# Patient Record
Sex: Male | Born: 1950 | Race: Black or African American | Hispanic: No | Marital: Married | Smoking: Former smoker
Health system: Southern US, Community
[De-identification: ages and names within clinical notes are randomized; demographics above are authoritative.]

## PROBLEM LIST (undated history)

## (undated) DIAGNOSIS — I10 Essential (primary) hypertension: Secondary | ICD-10-CM

## (undated) DIAGNOSIS — N189 Chronic kidney disease, unspecified: Secondary | ICD-10-CM

## (undated) HISTORY — PX: JOINT REPLACEMENT: SHX530

## (undated) HISTORY — DX: Chronic kidney disease, unspecified: N18.9

---

## 2017-02-06 ENCOUNTER — Emergency Department (HOSPITAL_COMMUNITY)
Admission: EM | Admit: 2017-02-06 | Discharge: 2017-02-06 | Disposition: A | Discharge status: Home / Self Care | Payer: Self-pay | Attending: Emergency Medicine | Admitting: Emergency Medicine

## 2017-02-06 ENCOUNTER — Encounter (HOSPITAL_COMMUNITY): Payer: Self-pay | Admitting: *Deleted

## 2017-02-06 ENCOUNTER — Other Ambulatory Visit: Payer: Self-pay

## 2017-02-06 DIAGNOSIS — R1032 Left lower quadrant pain: Secondary | ICD-10-CM | POA: Insufficient documentation

## 2017-02-06 DIAGNOSIS — Z5321 Procedure and treatment not carried out due to patient leaving prior to being seen by health care provider: Secondary | ICD-10-CM | POA: Insufficient documentation

## 2017-02-06 HISTORY — DX: Essential (primary) hypertension: I10

## 2017-02-06 NOTE — ED Triage Notes (Signed)
Pt is here with left groin pain for one week.  Pt states it was originally a sharp pain and has increased.  Pt has pain with movement but does not feel a knot there or have abdominal pain

## 2017-02-06 NOTE — ED Notes (Signed)
Called x3 for vitals. No answer. 

## 2017-02-06 NOTE — ED Notes (Signed)
Called pt x1 to re-assess vitals, no answer

## 2017-02-06 NOTE — ED Notes (Signed)
Called pt x2 for vital signs. No answer

## 2017-02-06 NOTE — ED Notes (Signed)
Called for patient without response

## 2017-02-08 ENCOUNTER — Emergency Department (HOSPITAL_COMMUNITY)
Admission: EM | Admit: 2017-02-08 | Discharge: 2017-02-08 | Disposition: A | Discharge status: Home / Self Care | Payer: Medicare Other | Attending: Emergency Medicine | Admitting: Emergency Medicine

## 2017-02-08 ENCOUNTER — Encounter (HOSPITAL_COMMUNITY): Payer: Self-pay | Admitting: Emergency Medicine

## 2017-02-08 ENCOUNTER — Other Ambulatory Visit: Payer: Self-pay

## 2017-02-08 DIAGNOSIS — Z5321 Procedure and treatment not carried out due to patient leaving prior to being seen by health care provider: Secondary | ICD-10-CM | POA: Insufficient documentation

## 2017-02-08 DIAGNOSIS — K4090 Unilateral inguinal hernia, without obstruction or gangrene, not specified as recurrent: Secondary | ICD-10-CM | POA: Insufficient documentation

## 2017-02-08 NOTE — ED Triage Notes (Signed)
Pt has inguinal hernia in left groin that he states has been there about 1 week.

## 2017-02-08 NOTE — ED Notes (Signed)
Per registration, pt left without being seen after triage. 

## 2017-12-23 ENCOUNTER — Ambulatory Visit: Payer: Medicare Other | Admitting: Family Medicine

## 2018-01-28 ENCOUNTER — Other Ambulatory Visit: Payer: Self-pay

## 2018-01-28 ENCOUNTER — Emergency Department (HOSPITAL_COMMUNITY)
Admission: EM | Admit: 2018-01-28 | Discharge: 2018-01-29 | Disposition: A | Discharge status: Home / Self Care | Payer: Medicare Other | Attending: Emergency Medicine | Admitting: Emergency Medicine

## 2018-01-28 ENCOUNTER — Encounter (HOSPITAL_COMMUNITY): Payer: Self-pay | Admitting: *Deleted

## 2018-01-28 DIAGNOSIS — Z96652 Presence of left artificial knee joint: Secondary | ICD-10-CM | POA: Insufficient documentation

## 2018-01-28 DIAGNOSIS — R232 Flushing: Secondary | ICD-10-CM | POA: Insufficient documentation

## 2018-01-28 DIAGNOSIS — Z87891 Personal history of nicotine dependence: Secondary | ICD-10-CM | POA: Insufficient documentation

## 2018-01-28 DIAGNOSIS — R5383 Other fatigue: Secondary | ICD-10-CM | POA: Diagnosis present

## 2018-01-28 DIAGNOSIS — I10 Essential (primary) hypertension: Secondary | ICD-10-CM | POA: Diagnosis not present

## 2018-01-28 DIAGNOSIS — R531 Weakness: Secondary | ICD-10-CM | POA: Insufficient documentation

## 2018-01-28 LAB — CBC
HCT: 44.2 % (ref 39.0–52.0)
Hemoglobin: 14.9 g/dL (ref 13.0–17.0)
MCH: 31.4 pg (ref 26.0–34.0)
MCHC: 33.7 g/dL (ref 30.0–36.0)
MCV: 93.2 fL (ref 80.0–100.0)
Platelets: 226 10*3/uL (ref 150–400)
RBC: 4.74 MIL/uL (ref 4.22–5.81)
RDW: 13.1 % (ref 11.5–15.5)
WBC: 4.2 10*3/uL (ref 4.0–10.5)
nRBC: 0 % (ref 0.0–0.2)

## 2018-01-28 LAB — CBG MONITORING, ED: Glucose-Capillary: 110 mg/dL — ABNORMAL HIGH (ref 70–99)

## 2018-01-28 LAB — BASIC METABOLIC PANEL
Anion gap: 11 (ref 5–15)
BUN: 16 mg/dL (ref 8–23)
CO2: 20 mmol/L — ABNORMAL LOW (ref 22–32)
Calcium: 10 mg/dL (ref 8.9–10.3)
Chloride: 108 mmol/L (ref 98–111)
Creatinine, Ser: 1.42 mg/dL — ABNORMAL HIGH (ref 0.61–1.24)
GFR calc Af Amer: 58 mL/min — ABNORMAL LOW (ref >60)
GFR calc non Af Amer: 50 mL/min — ABNORMAL LOW (ref >60)
Glucose, Bld: 120 mg/dL — ABNORMAL HIGH (ref 70–99)
POTASSIUM: 3.6 mmol/L (ref 3.5–5.1)
Sodium: 139 mmol/L (ref 135–145)

## 2018-01-28 NOTE — ED Triage Notes (Signed)
Pt is here with hot flashes since waking up today and has had these since August.   Pt reports increased blood pressures, vision blurred last night and not now, had episodes of feeling faint.  [facility]

## 2018-01-29 ENCOUNTER — Emergency Department (HOSPITAL_COMMUNITY): Payer: Medicare Other

## 2018-01-29 DIAGNOSIS — R5383 Other fatigue: Secondary | ICD-10-CM | POA: Diagnosis present

## 2018-01-29 DIAGNOSIS — R232 Flushing: Secondary | ICD-10-CM | POA: Diagnosis not present

## 2018-01-29 DIAGNOSIS — Z87891 Personal history of nicotine dependence: Secondary | ICD-10-CM | POA: Diagnosis not present

## 2018-01-29 DIAGNOSIS — Z96652 Presence of left artificial knee joint: Secondary | ICD-10-CM | POA: Diagnosis not present

## 2018-01-29 DIAGNOSIS — I10 Essential (primary) hypertension: Secondary | ICD-10-CM | POA: Diagnosis not present

## 2018-01-29 DIAGNOSIS — R531 Weakness: Secondary | ICD-10-CM | POA: Diagnosis not present

## 2018-01-29 LAB — I-STAT TROPONIN, ED: Troponin i, poc: 0 ng/mL (ref 0.00–0.08)

## 2018-01-29 LAB — TSH: TSH: 1.397 u[IU]/mL (ref 0.350–4.500)

## 2018-01-29 LAB — URINALYSIS, ROUTINE W REFLEX MICROSCOPIC
BILIRUBIN URINE: NEGATIVE
Bacteria, UA: NONE SEEN
Glucose, UA: NEGATIVE mg/dL
Hgb urine dipstick: NEGATIVE
KETONES UR: 20 mg/dL — AB
LEUKOCYTES UA: NEGATIVE
Nitrite: NEGATIVE
PROTEIN: 30 mg/dL — AB
Specific Gravity, Urine: 1.016 (ref 1.005–1.030)
pH: 5 (ref 5.0–8.0)

## 2018-01-29 MED ORDER — ACETAMINOPHEN 325 MG PO TABS
650.0000 mg | ORAL_TABLET | Freq: Once | ORAL | Status: AC
  Administered 2018-01-29: 650 mg via ORAL
  Filled 2018-01-29: qty 2

## 2018-01-29 MED ORDER — SODIUM CHLORIDE 0.9 % IV BOLUS
1000.0000 mL | Freq: Once | INTRAVENOUS | Status: AC
  Administered 2018-01-29: 1000 mL via INTRAVENOUS

## 2018-01-29 NOTE — ED Provider Notes (Signed)
[facility] Provider Note   CSN: [id number] Arrival date & time: 01/28/18  1659     History   Chief Complaint Chief Complaint  Patient presents with  . Weakness  . hot flashes    HPI Dan Cheneybdullah Sandoval is a 68 y.o. male.  The history is provided by the patient and medical records.  Weakness      68 year old male with history of hypertension, hyperlipidemia, depression, mood disorder, presenting to the ED for several concerns.  Patient reports since August he has not felt quite right.  States that the first part of August he began having a lot of hot flashes.  States initially it would happen for a day or so and then completely go away.  States lately these have become more frequent.  He saw his PCP at the [facility] about a month ago and had lots of lab work done and was told overall everything was normal.  He states they are referring him to endocrinology for further evaluation but has not yet had that appointment.  States of the past few days he has felt very fatigued with low energy and decreased appetite.  He has not had any nausea or vomiting.  He has been drinking fluids fine.  He does not have any focal numbness or weakness.  Does report some headaches but unsure if this is from dehydration.  States he has felt lightheaded at times and almost like he may pass out but has not lost consciousness.  He denies any chest pain or shortness of breath.  No noted fever, chills, cough, or other infectious symptoms.  He has had some frequent urination but denies any dysuria.  He has not had any recent changes in his medications.  Past Medical History:  Diagnosis Date  . Hypertension     There are no active problems to display for this patient.   Past Surgical History:  Procedure Laterality Date  . JOINT REPLACEMENT     left knee ligament repair        Home Medications    Prior to Admission medications   Not on File    Family History No family  history on file.  Social History Social History   Tobacco Use  . Smoking status: Former Games developermoker  . Smokeless tobacco: Never Used  Substance Use Topics  . Alcohol use: No    Frequency: Never  . Drug use: No     Allergies   Penicillins   Review of Systems Review of Systems  Neurological: Positive for weakness.  All other systems reviewed and are negative.    Physical Exam Updated Vital Signs BP (!) 157/105 (BP Location: Right Arm)   Pulse 88   Temp 97.9 F (36.6 C) (Oral)   Resp 14   SpO2 100%   Physical Exam Vitals signs and nursing note reviewed.  Constitutional:      Appearance: He is well-developed.  HENT:     Head: Normocephalic and atraumatic.     Right Ear: Tympanic membrane and ear canal normal.     Left Ear: Tympanic membrane and ear canal normal.     Nose: Nose normal.     Mouth/Throat:     Lips: Pink.     Mouth: Mucous membranes are moist.     Pharynx: Oropharynx is clear.  Eyes:     Conjunctiva/sclera: Conjunctivae normal.     Pupils: Pupils are equal, round, and reactive to light.     Comments: Haziness  of the eyes which is chronic, PERRL  Neck:     Musculoskeletal: Normal range of motion. No neck rigidity.     Comments: Full ROM, no rigidity Cardiovascular:     Rate and Rhythm: Normal rate and regular rhythm.     Heart sounds: Normal heart sounds.  Pulmonary:     Effort: Pulmonary effort is normal.     Breath sounds: Normal breath sounds. No decreased breath sounds, wheezing or rhonchi.  Abdominal:     General: Bowel sounds are normal. There is no distension.     Palpations: Abdomen is soft.     Tenderness: There is no abdominal tenderness.  Musculoskeletal: Normal range of motion.  Skin:    General: Skin is warm and dry.  Neurological:     Mental Status: He is alert and oriented to person, place, and time.     Comments: AAOx3, answering questions and following commands appropriately; equal strength UE and LE bilaterally; CN grossly  intact; moves all extremities appropriately without ataxia; no focal neuro deficits or facial asymmetry appreciated      ED Treatments / Results  Labs (all labs ordered are listed, but only abnormal results are displayed) Labs Reviewed  BASIC METABOLIC PANEL - Abnormal; Notable for the following components:      Result Value   CO2 20 (*)    Glucose, Bld 120 (*)    Creatinine, Ser 1.42 (*)    GFR calc non Af Amer 50 (*)    GFR calc Af Amer 58 (*)    All other components within normal limits  URINALYSIS, ROUTINE W REFLEX MICROSCOPIC - Abnormal; Notable for the following components:   Ketones, ur 20 (*)    Protein, ur 30 (*)    All other components within normal limits  CBG MONITORING, ED - Abnormal; Notable for the following components:   Glucose-Capillary 110 (*)    All other components within normal limits  CBC  TSH  I-STAT TROPONIN, ED    EKG None  Radiology Dg Chest 2 View  Result Date: 01/29/2018 CLINICAL DATA:  Generalized weakness EXAM: CHEST - 2 VIEW COMPARISON:  None. FINDINGS: Normal heart size. Normal mediastinal contour. No pneumothorax. No pleural effusion. Hyperinflated lungs. Symmetric nipple shadows overlie the lower lungs on the PA view. No pulmonary edema. No acute consolidative airspace disease. IMPRESSION: 1. No acute cardiopulmonary disease. 2. Hyperinflated lungs, suggesting COPD. Electronically Signed   By: Delbert Phenix M.D.   On: 01/29/2018 00:57    Procedures Procedures (including critical care time)  Medications Ordered in ED Medications  sodium chloride 0.9 % bolus 1,000 mL (0 mLs Intravenous Stopped 01/29/18 0257)  acetaminophen (TYLENOL) tablet 650 mg (650 mg Oral Given 01/29/18 0118)     Initial Impression / Assessment and Plan / ED Course  I have reviewed the triage vital signs and the nursing notes.  Pertinent labs & imaging results that were available during my care of the patient were reviewed by me and considered in my medical decision  making (see chart for details).  68 year old male here with generalized weakness and hot flashes.  This is been an intermittent issue since August 2019 (approx 4 months now).  States he has been seen by PCP and awaiting endocrinology referral.  He has had screening labs with primary care doctor without known cause.  States hot flashes are becoming more frequent and he is not sure what to do.  He is afebrile and nontoxic in appearance here.  He does  not have any infectious type symptoms.  His blood pressure is somewhat elevated and he has headache.  He does not have any focal weakness and is neurologically intact on exam.  He does not have any signs or symptoms suggestive of meningitis.  Screening labs were obtained from triage and are overall reassuring.  We will add troponin, chest x-ray, TSH, and urinalysis.  Patient has not had any imaging of his head thus far in his work-up, will obtain CT to evaluate for possible pituitary abnormalities.  3:05 AM Notified by CT that they attempted scan x2, however patient is very anxious and claustrophobic and does not feel he can tolerate this.    I discussed with patient, he is feeling better here after some fluids and Tylenol.  His headache has resolved.  BP is stable, no evidence of end organ damage today.  He is not currently having any further hot flashes.  We reviewed his labs and chest x-ray from earlier which are overall reassuring including TSH.  I do not feel strongly that he absolutely has to have head CT today is unlikely to change management acutely.  I do feel he would be best suited to follow-up with endocrinology, they will call in the morning and follow-up on scheduled appointment.  He was given copies of his labs and imaging studies from today's visit for physician review.  They will return here for any new or worsening symptoms.  Final Clinical Impressions(s) / ED Diagnoses   Final diagnoses:  Hot flashes  Generalized weakness    ED Discharge  Orders    None       Garlon HatchetSanders, Melvin Marmo M, PA-C 01/29/18 [id number]    Nira Connardama, Pedro Eduardo, MD 01/29/18 2115

## 2018-01-29 NOTE — Discharge Instructions (Signed)
Continue your regular medications. Make sure you are drinking plenty of fluids to stay hydrated. Follow-up with endocrinology-- call in the morning to follow-up about your appt.   Have your primary care doctor review labs and imaging studies from today's visit (attached on back). Return to the ED for new or worsening symptoms.

## 2018-01-29 NOTE — ED Notes (Signed)
Patient verbalizes understanding of discharge instructions. Opportunity for questioning and answers were provided. Armband removed by staff, pt discharged from ED. Ambulated out to lobby  

## 2019-06-04 IMAGING — CR DG CHEST 2V
2 series · 2 of 2 positions shown · non-contrast
Comparison: None.

CLINICAL DATA: Generalized weakness

EXAM:
CHEST - 2 VIEW

[chest pa]
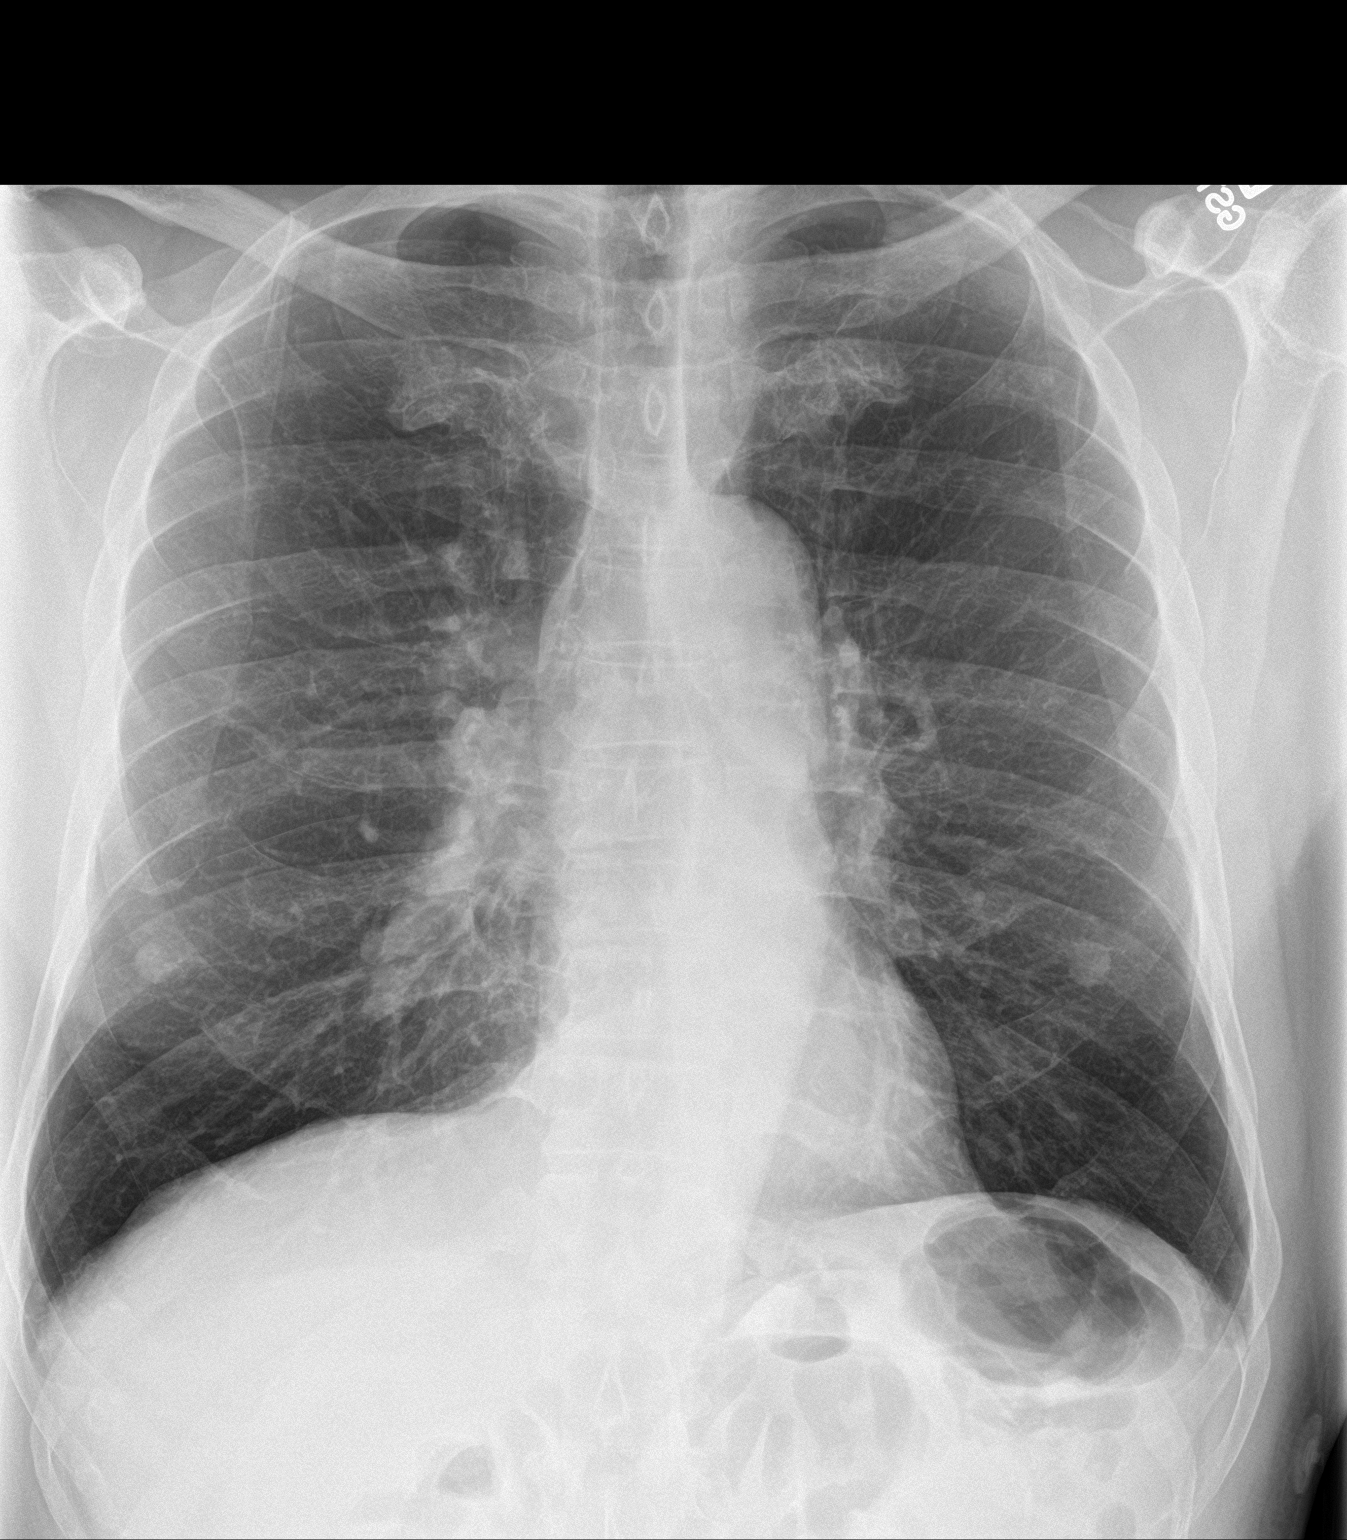

[chest lat]
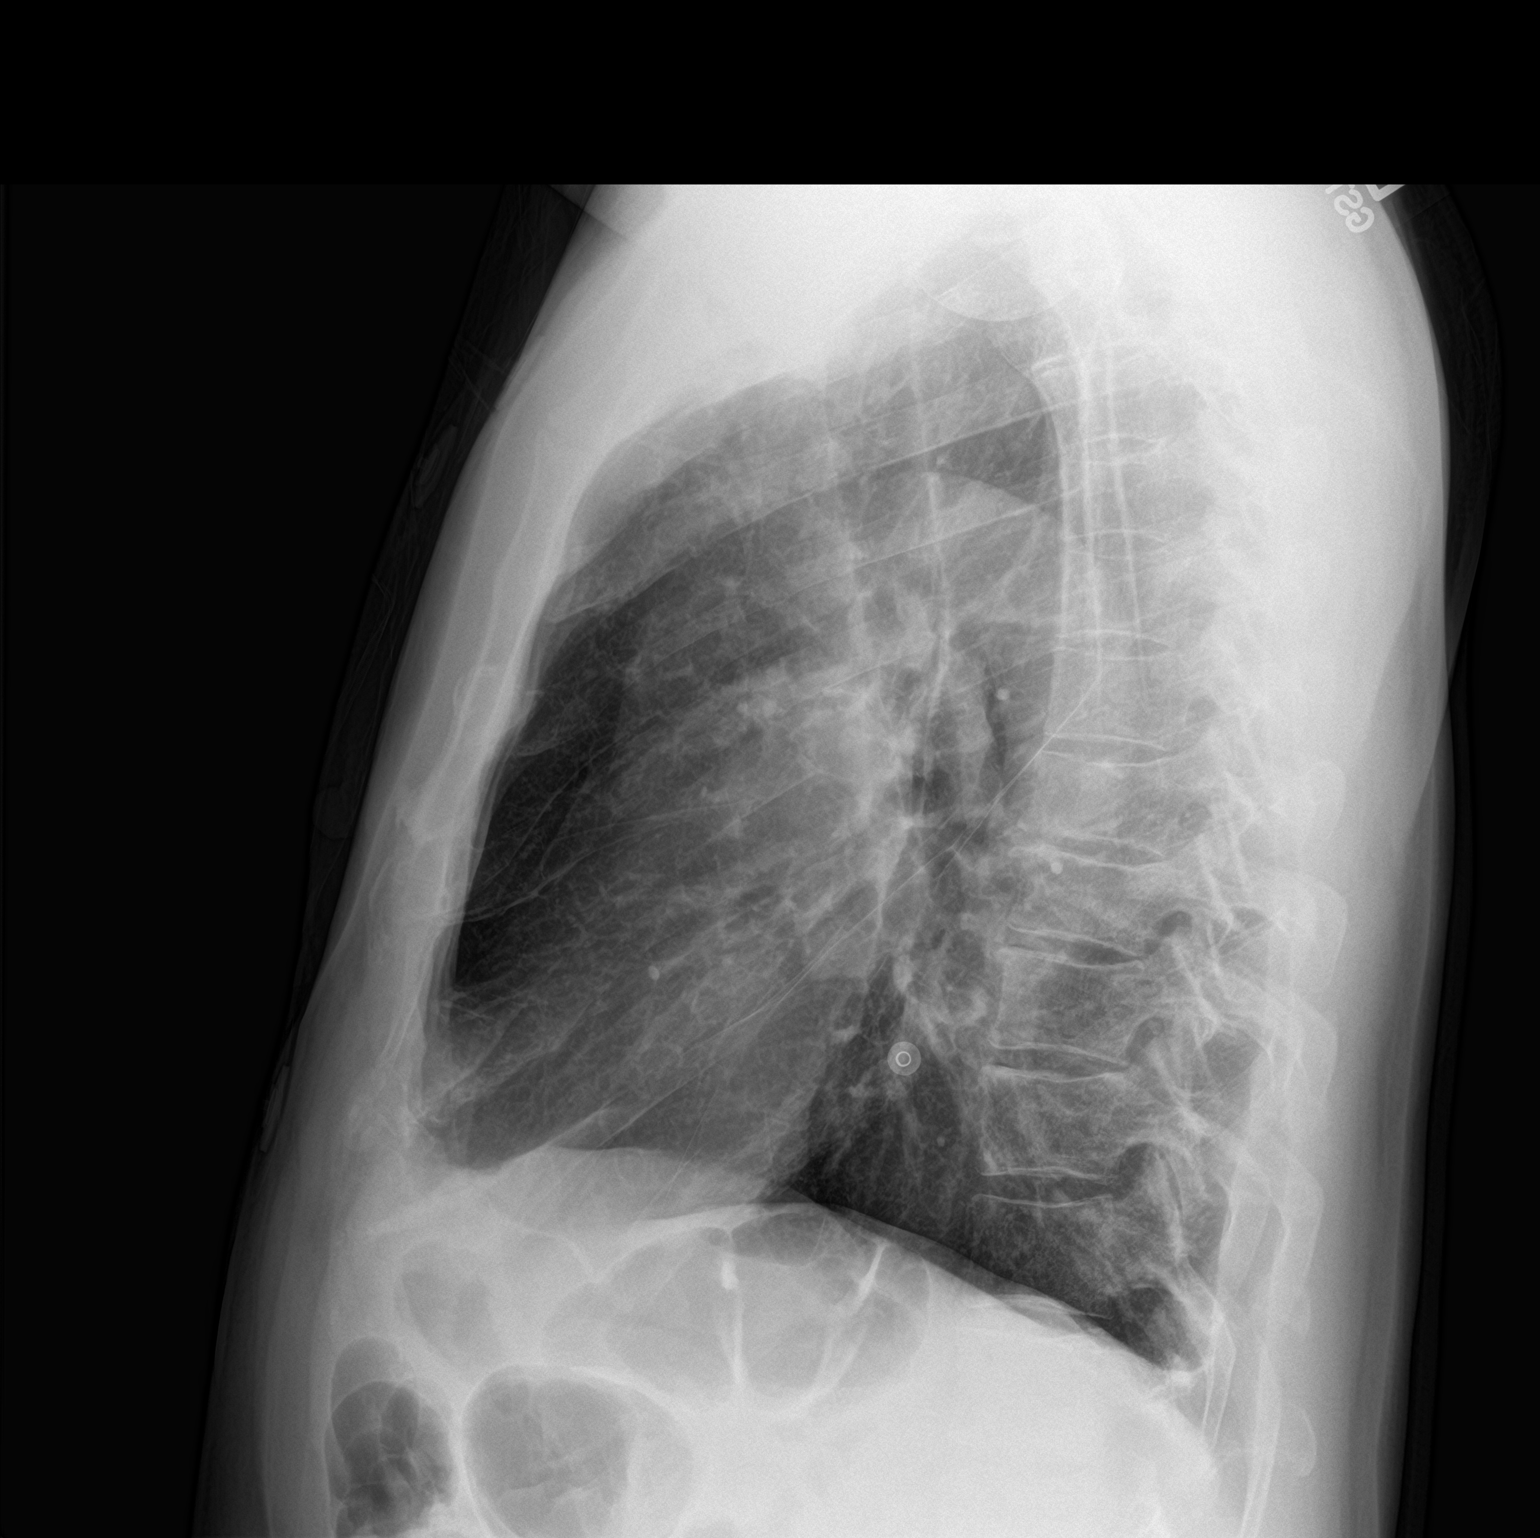

[2 of 2 positions shown; findings below may reference images not displayed]

FINDINGS: Normal heart size. Normal mediastinal contour. No pneumothorax. No
pleural effusion. Hyperinflated lungs. Symmetric nipple shadows
overlie the lower lungs on the PA view. No pulmonary edema. No acute
consolidative airspace disease.
IMPRESSION: 1. No acute cardiopulmonary disease.
2. Hyperinflated lungs, suggesting COPD.

## 2020-01-14 ENCOUNTER — Encounter (HOSPITAL_COMMUNITY): Payer: Self-pay

## 2020-01-14 ENCOUNTER — Other Ambulatory Visit: Payer: Self-pay

## 2020-01-14 ENCOUNTER — Ambulatory Visit (HOSPITAL_COMMUNITY)
Admission: EM | Admit: 2020-01-14 | Discharge: 2020-01-14 | Disposition: A | Discharge status: Home / Self Care | Payer: Medicare Other | Attending: Student | Admitting: Student

## 2020-01-14 DIAGNOSIS — L739 Follicular disorder, unspecified: Secondary | ICD-10-CM

## 2020-01-14 DIAGNOSIS — R21 Rash and other nonspecific skin eruption: Secondary | ICD-10-CM

## 2020-01-14 MED ORDER — MUPIROCIN CALCIUM 2 % EX CREA: 1.0000 "application " | TOPICAL_CREAM | Freq: Two times a day (BID) | CUTANEOUS | 0 refills | Status: DC

## 2020-01-14 NOTE — Discharge Instructions (Signed)
Apply ointment to your scalp twice daily. If no improvement in 1-2 weeks, make an appointment with a dermatologist.   If you have weakness/numbness in your arms or legs, chest pain, shortness of breath, fevers- seek medical attention.

## 2020-01-14 NOTE — ED Triage Notes (Signed)
Pt presents with ongoing headache for past few weeks; pt states he has a very tender spot on the top of his head.

## 2020-01-14 NOTE — ED Provider Notes (Signed)
[facility]    CSN: [id number] Arrival date & time: 01/14/20  0034      History   Chief Complaint Chief Complaint  Patient presents with  . Headache    HPI Dan Sandoval is a 69 y.o. male presenting with headache for few weeks. States that there's been a tender spot on top of head for 2 weeks. It's tender to the touch. He denies using new products, denies recent haircut though he does shave his head at home. Denies sensitivity to light, photophobia, phonophobia, tearing. Denies pain, weakness, sensation changes, numbness - in arms or legs. Denies chest pain. Denies dizziness. Denies LOC. Denies n/v/d.    HPI  Past Medical History:  Diagnosis Date  . Hypertension     There are no problems to display for this patient.   Past Surgical History:  Procedure Laterality Date  . JOINT REPLACEMENT     left knee ligament repair       Home Medications    Prior to Admission medications   Medication Sig Start Date End Date Taking? Authorizing Provider  acetaminophen (TYLENOL) 500 MG tablet Take 500 mg by mouth 2 (two) times daily as needed for mild pain.    [provider]  amLODipine (NORVASC) 10 MG tablet Take 10 mg by mouth daily.    [provider]  busPIRone (BUSPAR) 10 MG tablet Take 10 mg by mouth 3 (three) times daily.    [provider]  Cholecalciferol (VITAMIN D) 50 MCG (2000 UT) tablet Take 2,000 Units by mouth daily.    [provider]  hydrophilic ointment Apply topically as needed for dry skin.    [provider]  hydrOXYzine (VISTARIL) 25 MG capsule Take 25 mg by mouth every 6 (six) hours as needed for anxiety.    [provider]  losartan (COZAAR) 100 MG tablet Take 50 mg by mouth daily.    [provider]  methocarbamol (ROBAXIN) 500 MG tablet Take 500 mg by mouth at bedtime as needed for muscle spasms.    [provider]  Multiple Vitamin (MULTIVITAMIN WITH MINERALS) TABS  tablet Take 1 tablet by mouth daily.    [provider]  mupirocin cream (BACTROBAN) 2 % Apply 1 application topically 2 (two) times daily. 01/14/20   Rhys Martini, PA-C  omeprazole (PRILOSEC) 20 MG capsule Take 20 mg by mouth daily.    [provider]  Paraffin WAX by Does not apply route daily as needed (to affected area).    [provider]  prazosin (MINIPRESS) 1 MG capsule Take 1 mg by mouth at bedtime.    [provider]  tadalafil (CIALIS) 5 MG tablet Take 5 mg by mouth daily as needed for erectile dysfunction.    [provider]  venlafaxine XR (EFFEXOR-XR) 150 MG 24 hr capsule Take 150 mg by mouth daily with breakfast.    [provider]  zolpidem (AMBIEN) 10 MG tablet Take 10 mg by mouth at bedtime as needed for sleep.    [provider]    Family History History reviewed. No pertinent family history.  Social History Social History   Tobacco Use  . Smoking status: Former Games developer  . Smokeless tobacco: Never Used  Substance Use Topics  . Alcohol use: No  . Drug use: No     Allergies   Penicillins   Review of Systems Review of Systems  Constitutional: Negative for chills and fever.  HENT: Negative for congestion, ear pain,  sinus pain and sore throat.   Eyes: Negative for photophobia and visual disturbance.  Respiratory: Negative for cough, chest tightness and shortness of breath.   Cardiovascular: Negative for chest pain and palpitations.  Gastrointestinal: Negative for abdominal pain, diarrhea, nausea and vomiting.  Neurological: Positive for headaches. Negative for dizziness, tremors, syncope, speech difficulty, weakness and numbness.   Physical Exam Triage Vital Signs ED Triage Vitals  Enc Vitals Group     BP 01/14/20 1220 (!) 146/88     Pulse Rate 01/14/20 1220 91     Resp 01/14/20 1220 17     Temp 01/14/20 1220 99.2 F (37.3 C)     Temp Source 01/14/20 1220 Oral     SpO2 01/14/20 1220 97 %      Weight --      Height --      Head Circumference --      Peak Flow --      Pain Score 01/14/20 1219 6     Pain Loc --      Pain Edu? --      Excl. in GC? --    No data found.  Updated Vital Signs BP (!) 146/88 (BP Location: Right Arm)   Pulse 91   Temp 99.2 F (37.3 C) (Oral)   Resp 17   SpO2 97%   Visual Acuity Right Eye Distance:   Left Eye Distance:   Bilateral Distance:    Right Eye Near:   Left Eye Near:    Bilateral Near:     Physical Exam Vitals reviewed.  Constitutional:      General: He is not in acute distress.    Appearance: Normal appearance. He is well-developed. He is not ill-appearing.  HENT:     Head: Normocephalic and atraumatic.     Mouth/Throat:     Mouth: Mucous membranes are moist.  Eyes:     General: Vision grossly intact. Gaze aligned appropriately.     Extraocular Movements: Extraocular movements intact.     Pupils: Pupils are equal, round, and reactive to light.  Cardiovascular:     Rate and Rhythm: Normal rate and regular rhythm.     Heart sounds: Normal heart sounds.  Pulmonary:     Effort: Pulmonary effort is normal.     Breath sounds: Normal breath sounds and air entry.  Abdominal:     Palpations: Abdomen is soft.     Tenderness: There is no abdominal tenderness. There is no right CVA tenderness, left CVA tenderness, guarding or rebound.     Comments: No bowel or bladder incontinence.  Musculoskeletal:     Cervical back: Normal range of motion. No swelling, deformity, signs of trauma, rigidity, spasms, tenderness, bony tenderness or crepitus. No pain with movement.     Thoracic back: No swelling, deformity, signs of trauma, spasms, tenderness or bony tenderness. Normal range of motion. No scoliosis.     Lumbar back: No swelling, deformity, signs of trauma, spasms, tenderness or bony tenderness. Normal range of motion. Negative right straight leg raise test and negative left straight leg raise test. No scoliosis.     Comments:  Strength 5/5 in UEs and LEs.  Skin:    Comments: Frontal scalp with 3cm x3cm slightly raised and tender area. No hair loss, no rash, no vesicular lesions, no discharge.   Neurological:     General: No focal deficit present.     Mental Status: He is alert and oriented to person, place, and time.  Cranial Nerves: Cranial nerves are intact. No cranial nerve deficit.     Sensory: Sensation is intact.     Motor: Motor function is intact. No weakness.     Coordination: Coordination is intact.     Gait: Gait is intact. Gait normal.     Comments: CN 2-12 grossly intact. Strength 5/5 in UEs and LEs. Gait normal. Sensation intact in UEs and LEs.   Psychiatric:        Attention and Perception: Attention and perception normal.        Mood and Affect: Mood and affect normal.        Behavior: Behavior normal.        Thought Content: Thought content normal.        Judgment: Judgment normal.      UC Treatments / Results  Labs (all labs ordered are listed, but only abnormal results are displayed) Labs Reviewed - No data to display  EKG   Radiology No results found.  Procedures Procedures (including critical care time)  Medications Ordered in UC Medications - No data to display  Initial Impression / Assessment and Plan / UC Course  I have reviewed the triage vital signs and the nursing notes.  Pertinent labs & imaging results that were available during my care of the patient were reviewed by me and considered in my medical decision making (see chart for details).     Exam consistent with folliculitis of scalp. Mupirocin sent as below. Also rec he wash the area with J&J baby shampoo daily. rec f/u with derm if no improvement. Return precautions- numbness/weakness in arms/legs chest pain, shortness of breath, new/worsening fevers/chills, confusion, worsening of symptoms despite the above treatment plan, etc. Patient is in agreement with this treatment plan.  Final Clinical  Impressions(s) / UC Diagnoses   Final diagnoses:  Rash and nonspecific skin eruption  Folliculitis     Discharge Instructions     Apply ointment to your scalp twice daily. If no improvement in 1-2 weeks, make an appointment with a dermatologist.   If you have weakness/numbness in your arms or legs, chest pain, shortness of breath, fevers- seek medical attention.    ED Prescriptions    Medication Sig Dispense Auth. Provider   mupirocin cream (BACTROBAN) 2 % Apply 1 application topically 2 (two) times daily. 15 g Rhys Martini, PA-C     PDMP not reviewed this encounter.   Rhys Martini, PA-C 01/14/20 1400

## 2020-01-18 ENCOUNTER — Emergency Department (HOSPITAL_COMMUNITY)
Admission: EM | Admit: 2020-01-18 | Discharge: 2020-01-18 | Disposition: A | Discharge status: Home / Self Care | Payer: Medicare Other | Attending: Emergency Medicine | Admitting: Emergency Medicine

## 2020-01-18 ENCOUNTER — Other Ambulatory Visit: Payer: Self-pay

## 2020-01-18 ENCOUNTER — Encounter (HOSPITAL_COMMUNITY): Payer: Self-pay | Admitting: Pediatrics

## 2020-01-18 DIAGNOSIS — I10 Essential (primary) hypertension: Secondary | ICD-10-CM | POA: Diagnosis not present

## 2020-01-18 DIAGNOSIS — R630 Anorexia: Secondary | ICD-10-CM | POA: Diagnosis not present

## 2020-01-18 DIAGNOSIS — Z96652 Presence of left artificial knee joint: Secondary | ICD-10-CM | POA: Diagnosis not present

## 2020-01-18 DIAGNOSIS — N289 Disorder of kidney and ureter, unspecified: Secondary | ICD-10-CM | POA: Diagnosis not present

## 2020-01-18 DIAGNOSIS — Z87891 Personal history of nicotine dependence: Secondary | ICD-10-CM | POA: Diagnosis not present

## 2020-01-18 DIAGNOSIS — Z79899 Other long term (current) drug therapy: Secondary | ICD-10-CM | POA: Insufficient documentation

## 2020-01-18 DIAGNOSIS — R109 Unspecified abdominal pain: Secondary | ICD-10-CM | POA: Diagnosis present

## 2020-01-18 LAB — COMPREHENSIVE METABOLIC PANEL
ALT: 20 U/L (ref 0–44)
AST: 29 U/L (ref 15–41)
Albumin: 3.3 g/dL — ABNORMAL LOW (ref 3.5–5.0)
Alkaline Phosphatase: 34 U/L — ABNORMAL LOW (ref 38–126)
Anion gap: 15 (ref 5–15)
BUN: 36 mg/dL — ABNORMAL HIGH (ref 8–23)
CO2: 21 mmol/L — ABNORMAL LOW (ref 22–32)
Calcium: 9.4 mg/dL (ref 8.9–10.3)
Chloride: 108 mmol/L (ref 98–111)
Creatinine, Ser: 1.86 mg/dL — ABNORMAL HIGH (ref 0.61–1.24)
GFR, Estimated: 38 mL/min — ABNORMAL LOW (ref >60)
Glucose, Bld: 93 mg/dL (ref 70–99)
Potassium: 3.9 mmol/L (ref 3.5–5.1)
Sodium: 144 mmol/L (ref 135–145)
Total Bilirubin: 1 mg/dL (ref 0.3–1.2)
Total Protein: 8.2 g/dL — ABNORMAL HIGH (ref 6.5–8.1)

## 2020-01-18 LAB — CBC
HCT: 44.4 % (ref 39.0–52.0)
Hemoglobin: 14.3 g/dL (ref 13.0–17.0)
MCH: 31 pg (ref 26.0–34.0)
MCHC: 32.2 g/dL (ref 30.0–36.0)
MCV: 96.3 fL (ref 80.0–100.0)
Platelets: 200 10*3/uL (ref 150–400)
RBC: 4.61 MIL/uL (ref 4.22–5.81)
RDW: 13.2 % (ref 11.5–15.5)
WBC: 4.4 10*3/uL (ref 4.0–10.5)
nRBC: 0 % (ref 0.0–0.2)

## 2020-01-18 LAB — CBG MONITORING, ED
Glucose-Capillary: 92 mg/dL (ref 70–99)
Glucose-Capillary: 86 mg/dL (ref 70–99)

## 2020-01-18 LAB — LIPASE, BLOOD: Lipase: 38 U/L (ref 11–51)

## 2020-01-18 MED ORDER — SODIUM CHLORIDE 0.9 % IV BOLUS
1000.0000 mL | Freq: Once | INTRAVENOUS | Status: AC
  Administered 2020-01-18: 13:00:00 1000 mL via INTRAVENOUS

## 2020-01-18 MED ORDER — OMEPRAZOLE 20 MG PO CPDR: 20.0000 mg | DELAYED_RELEASE_CAPSULE | Freq: Every day | ORAL | 0 refills | Status: DC

## 2020-01-18 NOTE — ED Triage Notes (Signed)
Wife in room, assisting with triage info. Concern for decreased appetite x 4 weeks. Stated he hasn't been himself lately. Hx of PTSD,

## 2020-01-18 NOTE — ED Provider Notes (Signed)
[facility] EMERGENCY DEPARTMENT Provider Note   CSN: [id number] Arrival date & time: 01/18/20  0840     History   Dan Sandoval is a 69 y.o. male.  Patient presents to the ER chief complaint of decreased appetite and "bad taste in his mouth.".  Symptoms been ongoing for 4 weeks.  He states he spoke with his primary care doctor who "has not done anything about it."  Presents to the ER for repeat evaluation.  States that he does not have an appetite because every time he eats he has a bad taste in his mouth.  Transportation states abdominal pain, however patient denies any abdominal pain at this time.  He denies any chest discomfort or shortness of breath or palpitations.        Past Medical History:  Diagnosis Date  . Hypertension     There are no problems to display for this patient.   Past Surgical History:  Procedure Laterality Date  . JOINT REPLACEMENT     left knee ligament repair       No family history on file.  Social History   Tobacco Use  . Smoking status: Former Games developer  . Smokeless tobacco: Never Used  Substance Use Topics  . Alcohol use: No  . Drug use: No    Home Medications Prior to Admission medications   Medication Sig Start Date End Date Taking? Authorizing Provider  acetaminophen (TYLENOL) 500 MG tablet Take 500 mg by mouth 2 (two) times daily as needed for mild pain.   Yes [provider]  amLODipine (NORVASC) 10 MG tablet Take 10 mg by mouth daily.   Yes [provider]  Brinzolamide-Brimonidine 1-0.2 % SUSP Place 1 drop into both eyes in the morning, at noon, and at bedtime.   Yes [provider]  busPIRone (BUSPAR) 10 MG tablet Take 10 mg by mouth 2 (two) times daily.   Yes [provider]  Cholecalciferol (VITAMIN D) 50 MCG (2000 UT) tablet Take 2,000 Units by mouth daily.   Yes [provider]  hydrophilic ointment Apply topically as needed for dry skin.   Yes [provider]  hydrOXYzine (VISTARIL) 25 MG capsule Take 25 mg by mouth See admin instructions. Once daily in the morning. May take an additional 25mg  as needed for anxiety.   Yes [provider]  latanoprost (XALATAN) 0.005 % ophthalmic solution Place 1 drop into both eyes at bedtime.   Yes [provider]  losartan (COZAAR) 100 MG tablet Take 50 mg by mouth daily.   Yes [provider]  methocarbamol (ROBAXIN) 500 MG tablet Take 500 mg by mouth at bedtime as needed for muscle spasms.   Yes [provider]  Multiple Vitamin (MULTIVITAMIN WITH MINERALS) TABS tablet Take 1 tablet by mouth daily.   Yes [provider]  mupirocin cream (BACTROBAN) 2 % Apply 1 application topically 2 (two) times daily. Patient taking differently: Apply 1 application topically 2 (two) times daily as needed (dry skin on ankles). 01/14/20  Yes 01/16/20, PA-C  prazosin (MINIPRESS) 1 MG capsule Take 1 mg by mouth at bedtime.   Yes [provider]  tadalafil (CIALIS) 5 MG tablet Take 5 mg by mouth daily as needed for erectile dysfunction.   Yes [provider]  omeprazole (PRILOSEC) 20 MG capsule Take 1 capsule (20 mg total) by mouth daily for 20 days. 01/18/20 02/07/20  04/06/20, MD    Allergies  Atorvastatin, Lisinopril, Penicillins, Pravastatin, Rosuvastatin, and Simvastatin  Review of Systems   Review of Systems  Constitutional: Negative for fever.  HENT: Negative for ear pain and sore throat.   Eyes: Negative for pain.  Respiratory: Negative for cough.   Cardiovascular: Negative for chest pain.  Gastrointestinal: Negative for vomiting.  Genitourinary: Negative for flank pain.  Musculoskeletal: Negative for back pain.  Skin: Negative for color change and rash.  Neurological: Negative for syncope.  All other systems reviewed and are negative.   Physical Exam Updated Vital Signs BP (!) 148/90   Pulse 90   Temp 98.3 F (36.8 C)  (Oral)   Resp (!) 21   Ht 5\' 11"  (1.803 m)   Wt 83.9 kg   SpO2 95%   BMI 25.80 kg/m   Physical Exam Constitutional:      General: He is not in acute distress.    Appearance: He is well-developed.  HENT:     Head: Normocephalic.     Mouth/Throat:     Mouth: Mucous membranes are moist.  Cardiovascular:     Rate and Rhythm: Normal rate.  Pulmonary:     Effort: Pulmonary effort is normal.  Abdominal:     Palpations: Abdomen is soft.  Musculoskeletal:     Right lower leg: No edema.     Left lower leg: No edema.  Skin:    General: Skin is warm.     Capillary Refill: Capillary refill takes less than 2 seconds.  Neurological:     General: No focal deficit present.     Mental Status: He is alert.     ED Results / Procedures / Treatments   Labs (all labs ordered are listed, but only abnormal results are displayed) Labs Reviewed  COMPREHENSIVE METABOLIC PANEL - Abnormal; Notable for the following components:      Result Value   CO2 21 (*)    BUN 36 (*)    Creatinine, Ser 1.86 (*)    Total Protein 8.2 (*)    Albumin 3.3 (*)    Alkaline Phosphatase 34 (*)    GFR, Estimated 38 (*)    All other components within normal limits  LIPASE, BLOOD  CBC  CBG MONITORING, ED  CBG MONITORING, ED    EKG None  Radiology No results found.  Procedures Procedures (including critical care time)  Medications Ordered in ED Medications  sodium chloride 0.9 % bolus 1,000 mL (0 mLs Intravenous Stopped 01/18/20 1504)    ED Course  I have reviewed the triage vital signs and the nursing notes.  Pertinent labs & imaging results that were available during my care of the patient were reviewed by me and considered in my medical decision making (see chart for details).    MDM Rules/Calculators/A&P                          Patient exam is benign with no guarding or rebound no CVA tenderness noted.  Patient awake and alert no focal neuro deficit found strength 5/5 all  extremities.  Labs are unremarkable as well.  Mild renal insufficiency seen with a creatinine of 1.8 BUN of 36.  Patient given reports of fluid hydration.  We will prescribe Prilosec for possible reflux disease.  Advised continued follow-up and close monitoring with his primary care doctor within the week.  As needed return if he has chest pain, breathing abdominal pain or any additional concerns. Final Clinical Impression(s) / ED Diagnoses  Final diagnoses:  Appetite loss    Rx / DC Orders ED Discharge Orders         Ordered    omeprazole (PRILOSEC) 20 MG capsule  Daily        01/18/20 1609           Cheryll Cockayne, MD 01/18/20 1609

## 2020-01-18 NOTE — Discharge Instructions (Addendum)
Call your primary care doctor or specialist as discussed in the next 2-3 days.   Return immediately back to the ER if:  Your symptoms worsen within the next 12-24 hours. You develop new symptoms such as new fevers, persistent vomiting, new pain, shortness of breath, or new weakness or numbness, or if you have any other concerns.  

## 2020-01-18 NOTE — ED Provider Notes (Signed)
[facility]ERG[facility] Provider Note   CSN: [id number] Arrival date & time: 01/18/20  0840     History Chief Complaint  Patient presents with  . Abdominal Pain    Dan Sandoval is a 69 y.o. male.  Patient presents complaining of 1 month.  Of decreased appetite and decreased fluid intake.  He states that he does have an appetite because he has a foul taste in his mouth.  She has rotation states abdominal pain, the patient denies abdominal pain.  He states he has generalized body aches.  Nonspecific abdominal pain.  Denies headache or chest pain or abdominal pain.  No fever no cough no vomiting no diarrhea.  Patient complaining of generalized weakness and decreased appetite.        Past Medical History:  Diagnosis Date  . Hypertension     There are no problems to display for this patient.   Past Surgical History:  Procedure Laterality Date  . JOINT REPLACEMENT     left knee ligament repair       No family history on file.  Social History   Tobacco Use  . Smoking status: Former Games developer  . Smokeless tobacco: Never Used  Substance Use Topics  . Alcohol use: No  . Drug use: No    Home Medications Prior to Admission medications   Medication Sig Start Date End Date Taking? Authorizing Provider  acetaminophen (TYLENOL) 500 MG tablet Take 500 mg by mouth 2 (two) times daily as needed for mild pain.   Yes [provider]  amLODipine (NORVASC) 10 MG tablet Take 10 mg by mouth daily.   Yes [provider]  Brinzolamide-Brimonidine 1-0.2 % SUSP Place 1 drop into both eyes in the morning, at noon, and at bedtime.   Yes [provider]  busPIRone (BUSPAR) 10 MG tablet Take 10 mg by mouth 2 (two) times daily.   Yes [provider]  Cholecalciferol (VITAMIN D) 50 MCG (2000 UT) tablet Take 2,000 Units by mouth daily.   Yes [provider]  hydrophilic ointment Apply topically as needed for dry skin.    Yes [provider]  hydrOXYzine (VISTARIL) 25 MG capsule Take 25 mg by mouth See admin instructions. Once daily in the morning. May take an additional 25mg  as needed for anxiety.   Yes [provider]  latanoprost (XALATAN) 0.005 % ophthalmic solution Place 1 drop into both eyes at bedtime.   Yes [provider]  losartan (COZAAR) 100 MG tablet Take 50 mg by mouth daily.   Yes [provider]  methocarbamol (ROBAXIN) 500 MG tablet Take 500 mg by mouth at bedtime as needed for muscle spasms.   Yes [provider]  Multiple Vitamin (MULTIVITAMIN WITH MINERALS) TABS tablet Take 1 tablet by mouth daily.   Yes [provider]  mupirocin cream (BACTROBAN) 2 % Apply 1 application topically 2 (two) times daily. Patient taking differently: Apply 1 application topically 2 (two) times daily as needed (dry skin on ankles). 01/14/20  Yes 01/16/20, PA-C  prazosin (MINIPRESS) 1 MG capsule Take 1 mg by mouth at bedtime.   Yes [provider]  tadalafil (CIALIS) 5 MG tablet Take 5 mg by mouth daily as needed for erectile dysfunction.   Yes [provider]  omeprazole (PRILOSEC) 20 MG capsule Take 1 capsule (20 mg total) by mouth daily for 20 days. 01/18/20 02/07/20  04/06/20, MD    Allergies    Atorvastatin,  Lisinopril, Penicillins, Pravastatin, Rosuvastatin, and Simvastatin  Review of Systems   Review of Systems  Constitutional: Negative for fever.  HENT: Negative for ear pain and sore throat.   Eyes: Negative for pain.  Respiratory: Negative for cough.   Cardiovascular: Negative for chest pain.  Gastrointestinal: Negative for abdominal pain.  Genitourinary: Negative for flank pain.  Musculoskeletal: Negative for back pain.  Skin: Negative for color change and rash.  Neurological: Negative for syncope.  All other systems reviewed and are negative.   Physical Exam Updated Vital Signs BP (!) 148/90   Pulse 90   Temp  98.3 F (36.8 C) (Oral)   Resp (!) 21   Ht 5\' 11"  (1.803 m)   Wt 83.9 kg   SpO2 95%   BMI 25.80 kg/m   Physical Exam Constitutional:      General: He is not in acute distress.    Appearance: He is well-developed.  HENT:     Head: Normocephalic.     Mouth/Throat:     Mouth: Mucous membranes are moist.  Cardiovascular:     Rate and Rhythm: Normal rate.  Pulmonary:     Effort: Pulmonary effort is normal.  Abdominal:     Palpations: Abdomen is soft.  Musculoskeletal:     Right lower leg: No edema.     Left lower leg: No edema.  Skin:    General: Skin is warm.     Capillary Refill: Capillary refill takes less than 2 seconds.  Neurological:     General: No focal deficit present.     Mental Status: He is alert.     ED Results / Procedures / Treatments   Labs (all labs ordered are listed, but only abnormal results are displayed) Labs Reviewed  COMPREHENSIVE METABOLIC PANEL - Abnormal; Notable for the following components:      Result Value   CO2 21 (*)    BUN 36 (*)    Creatinine, Ser 1.86 (*)    Total Protein 8.2 (*)    Albumin 3.3 (*)    Alkaline Phosphatase 34 (*)    GFR, Estimated 38 (*)    All other components within normal limits  LIPASE, BLOOD  CBC  CBG MONITORING, ED  CBG MONITORING, ED    EKG None  Radiology No results found.  Procedures Procedures (including critical care time)  Medications Ordered in ED Medications  sodium chloride 0.9 % bolus 1,000 mL (0 mLs Intravenous Stopped 01/18/20 1504)    ED Course  I have reviewed the triage vital signs and the nursing notes.  Pertinent labs & imaging results that were available during my care of the patient were reviewed by me and considered in my medical decision making (see chart for details).    MDM Rules/Calculators/A&P                          Labs returned unremarkable mild renal insufficiency noted.  Patient given a liter bolus of fluids.  Otherwise has no abdominal tenderness or focal  deficit on neuro exam.  Recommend close outpatient follow-up with his primary care doctor within the week.  Advised immediate return for persisting or worsening symptoms pain fevers or any additional concerns.   Final Clinical Impression(s) / ED Diagnoses Final diagnoses:  Appetite loss    Rx / DC Orders ED Discharge Orders         Ordered    omeprazole (PRILOSEC) 20 MG capsule  Daily  01/18/20 1609           Cheryll Cockayne, MD 01/18/20 1615

## 2021-08-15 ENCOUNTER — Encounter: Payer: Self-pay | Admitting: Skilled Nursing Facility1

## 2021-08-15 ENCOUNTER — Encounter: Payer: Medicare Other | Attending: Infectious Diseases | Admitting: Skilled Nursing Facility1

## 2021-08-15 DIAGNOSIS — Z713 Dietary counseling and surveillance: Secondary | ICD-10-CM | POA: Insufficient documentation

## 2021-08-15 DIAGNOSIS — N189 Chronic kidney disease, unspecified: Secondary | ICD-10-CM | POA: Insufficient documentation

## 2021-08-15 DIAGNOSIS — Z6827 Body mass index (BMI) 27.0-27.9, adult: Secondary | ICD-10-CM | POA: Diagnosis not present

## 2021-08-15 DIAGNOSIS — I129 Hypertensive chronic kidney disease with stage 1 through stage 4 chronic kidney disease, or unspecified chronic kidney disease: Secondary | ICD-10-CM | POA: Insufficient documentation

## 2021-08-15 NOTE — Progress Notes (Signed)
Medical Nutrition Therapy  Appointment Start time:  2:42  Appointment End time:  3:20  Primary concerns today: CKD   Referral diagnosis: CKD stage 3    NUTRITION ASSESSMENT    Clinical Medical Hx: HTN, CKD stage 3 Medications: see list Labs: LDL cholesterol 143, total cholesterol 212, creatinine 1.47, GFR 51, WBC 2.78 Notable Signs/Symptoms: headache once every other week, some back pain   Lifestyle & Dietary Hx  Pt states his GFR went from 56 to 44 but then got it back up to 51.  Pt states he has a daily bowel movement without issue. Pt states he makes smoothies daily: kale, flax, fruit, chia seed, melons or fruit juice. Pt sates he has been into smoothies for about 10 years now. Pt states the smoothies keep him from eating a lot of meat, he does eat chicken and beef and sometimes fish sautes fish.  Pt states he used to be a vegetarian in the 70's but got out of it.   Drinks 50 ounces a day of smoothie  Sometimes once a week will wake up after having gone to sleep and make popcorn.     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, wears his C-PAP nightly  Stress / self-care: no issues Current average weekly physical activity: resistance/weight machine   24-Hr Dietary Recall: wakes around 5-6am First Meal 7-8am: raisin bran cereal (oatmilk) + 1 egg + hash brown or smoothie Snack:  Second Meal: 25 ounces smoothie or eaten out (not often) or tuna fish sandwich Snack: smoothie Third Meal: smoothie + spagetti and sauce or fish or beans and rice  Snack:  Beverages: water, some hot , juice  Estimated Energy Needs Calories: 2200   NUTRITION DIAGNOSIS  NB-1.1 Food and nutrition-related knowledge deficit As related to CKD diagnosis.  As evidenced by questions asked and referral for the appointment .   NUTRITION INTERVENTION  Nutrition education (E-1) on the  following topics:  CKD Plant based eating Appropriate protein portions  Proper hydration   Learning Style & Readiness for Change Teaching method utilized: Visual & Auditory  Demonstrated degree of understanding via: Teach Back  Barriers to learning/adherence to lifestyle change: none identified   Goals Established by Pt Add a protein to your smoothie: peanut butter powder or greek yogurt or soy milk   MONITORING & EVALUATION Dietary intake, weekly physical activity  Next Steps  Patient is to call or email with any concerns or questions

## 2021-08-29 ENCOUNTER — Ambulatory Visit: Payer: Medicare Other | Admitting: Skilled Nursing Facility1

## 2022-08-10 ENCOUNTER — Emergency Department (HOSPITAL_COMMUNITY): Payer: Medicare Other

## 2022-08-10 ENCOUNTER — Other Ambulatory Visit: Payer: Self-pay

## 2022-08-10 ENCOUNTER — Encounter (HOSPITAL_COMMUNITY): Payer: Self-pay

## 2022-08-10 ENCOUNTER — Emergency Department (HOSPITAL_COMMUNITY)
Admission: EM | Admit: 2022-08-10 | Discharge: 2022-08-10 | Disposition: A | Discharge status: Home / Self Care | Payer: Medicare Other | Attending: Emergency Medicine | Admitting: Emergency Medicine

## 2022-08-10 DIAGNOSIS — Z79899 Other long term (current) drug therapy: Secondary | ICD-10-CM | POA: Insufficient documentation

## 2022-08-10 DIAGNOSIS — I10 Essential (primary) hypertension: Secondary | ICD-10-CM | POA: Diagnosis not present

## 2022-08-10 DIAGNOSIS — R1319 Other dysphagia: Secondary | ICD-10-CM

## 2022-08-10 DIAGNOSIS — R030 Elevated blood-pressure reading, without diagnosis of hypertension: Secondary | ICD-10-CM

## 2022-08-10 DIAGNOSIS — R1310 Dysphagia, unspecified: Secondary | ICD-10-CM | POA: Insufficient documentation

## 2022-08-10 LAB — CBC
HCT: 42.2 % (ref 39.0–52.0)
Hemoglobin: 14.1 g/dL (ref 13.0–17.0)
MCH: 30.8 pg (ref 26.0–34.0)
MCHC: 33.4 g/dL (ref 30.0–36.0)
MCV: 92.1 fL (ref 80.0–100.0)
Platelets: 217 10*3/uL (ref 150–400)
RBC: 4.58 MIL/uL (ref 4.22–5.81)
RDW: 13.7 % (ref 11.5–15.5)
WBC: 4 10*3/uL (ref 4.0–10.5)
nRBC: 0 % (ref 0.0–0.2)

## 2022-08-10 LAB — COMPREHENSIVE METABOLIC PANEL
ALT: 25 U/L (ref 0–44)
AST: 32 U/L (ref 15–41)
Albumin: 4.3 g/dL (ref 3.5–5.0)
Alkaline Phosphatase: 42 U/L (ref 38–126)
Anion gap: 16 — ABNORMAL HIGH (ref 5–15)
BUN: 13 mg/dL (ref 8–23)
CO2: 20 mmol/L — ABNORMAL LOW (ref 22–32)
Calcium: 10 mg/dL (ref 8.9–10.3)
Chloride: 104 mmol/L (ref 98–111)
Creatinine, Ser: 1.46 mg/dL — ABNORMAL HIGH (ref 0.61–1.24)
GFR, Estimated: 51 mL/min — ABNORMAL LOW (ref >60)
Glucose, Bld: 90 mg/dL (ref 70–99)
Potassium: 4.2 mmol/L (ref 3.5–5.1)
Sodium: 140 mmol/L (ref 135–145)
Total Bilirubin: 1.8 mg/dL — ABNORMAL HIGH (ref 0.3–1.2)
Total Protein: 7.7 g/dL (ref 6.5–8.1)

## 2022-08-10 LAB — TROPONIN I (HIGH SENSITIVITY): Troponin I (High Sensitivity): 6 ng/L (ref <18)

## 2022-08-10 MED ORDER — ALBUTEROL SULFATE HFA 108 (90 BASE) MCG/ACT IN AERS: 2.0000 | INHALATION_SPRAY | RESPIRATORY_TRACT | 0 refills | Status: DC | PRN

## 2022-08-10 NOTE — Discharge Instructions (Addendum)
It was our pleasure to provide your ER care today - we hope that you feel better.  Follow up closely with GI doctor in the coming week - see attached info - call office this Monday morning - when you call, tell them that you were in the ER, and we discussed your case with Dr Tomasa Rand, who wanted you follow up there then.   Drink plenty of fluids/stay well hydrated. Supplement nutrition with smoothies, Boost, Ensure, or other nutritious shake.   Also follow up closely with your primary care doctor in the coming week. Have your blood pressure rechecked then as it is high today.  Return to ER right away if worse, new symptoms, fevers, chest pain, increased trouble breathing, if food gets stuck in esophagus and wont pass, persistent vomiting, not able to tolerate liquids, or other concern.

## 2022-08-10 NOTE — ED Triage Notes (Signed)
Pt reports sob when bending over and difficulty swallowing over the past 3 days. Airway intact, pt maintaining secretions

## 2022-08-10 NOTE — ED Provider Notes (Addendum)
[place] EMERGENCY DEPARTMENT AT [facility] Provider Note   CSN: [id number] Arrival date & time: 08/10/22  1405     History  Chief Complaint  Patient presents with   Shortness of Breath   Dysphagia    Dan Sandoval is a 72 y.o. male.  Pt indicates in the past few days, trouble swallowing solid foods. Indicates last ate some french fries 3 days ago, and had to be careful, chew thoroughly. Indicates otherwise it feels like solids get stuck in mid to lower esophagus and causing him to choke/heave, but eventually go down. Since then has been taking liquids and smoothies, and has been able to get  those down. No vomiting. No abd pain. No chest pain. Felt mildly sob a few days ago but no current sob. No cough, sore throat, or uri symptoms. No fever or chills. No weight loss. Denies prior endoscopy.   The history is provided by the patient and a relative.  Shortness of Breath Associated symptoms: no abdominal pain, no chest pain, no cough, no fever, no headaches, no neck pain, no rash, no sore throat and no vomiting        Home Medications Prior to Admission medications   Medication Sig Start Date End Date Taking? Authorizing Provider  acetaminophen (TYLENOL) 500 MG tablet Take 500 mg by mouth 2 (two) times daily as needed for mild pain.    [provider]  amLODipine (NORVASC) 10 MG tablet Take 10 mg by mouth daily.    [provider]  Brinzolamide-Brimonidine 1-0.2 % SUSP Place 1 drop into both eyes in the morning, at noon, and at bedtime.    [provider]  busPIRone (BUSPAR) 10 MG tablet Take 10 mg by mouth 2 (two) times daily.    [provider]  Cholecalciferol (VITAMIN D) 50 MCG (2000 UT) tablet Take 2,000 Units by mouth daily.    [provider]  hydrophilic ointment Apply topically as needed for dry skin.    [provider]  hydrOXYzine (VISTARIL) 25 MG capsule Take 25 mg by mouth See admin instructions. Once  daily in the morning. May take an additional 25mg  as needed for anxiety.    [provider]  latanoprost (XALATAN) 0.005 % ophthalmic solution Place 1 drop into both eyes at bedtime.    [provider]  losartan (COZAAR) 100 MG tablet Take 50 mg by mouth daily.    [provider]  methocarbamol (ROBAXIN) 500 MG tablet Take 500 mg by mouth at bedtime as needed for muscle spasms.    [provider]  Multiple Vitamin (MULTIVITAMIN WITH MINERALS) TABS tablet Take 1 tablet by mouth daily.    [provider]  mupirocin cream (BACTROBAN) 2 % Apply 1 application topically 2 (two) times daily. Patient taking differently: Apply 1 application topically 2 (two) times daily as needed (dry skin on ankles). 01/14/20   Rhys Martini, PA-C  omeprazole (PRILOSEC) 20 MG capsule Take 1 capsule (20 mg total) by mouth daily for 20 days. 01/18/20 02/07/20  Cheryll Cockayne, MD  prazosin (MINIPRESS) 1 MG capsule Take 1 mg by mouth at bedtime.    [provider]  tadalafil (CIALIS) 5 MG tablet Take 5 mg by mouth daily as needed for erectile dysfunction.    [provider]      Allergies    Atorvastatin, Lisinopril, Penicillins, Pravastatin, Rosuvastatin, and Simvastatin    Review of Systems   Review of Systems  Constitutional:  Negative  for chills and fever.  HENT:  Positive for trouble swallowing. Negative for sore throat.   Eyes:  Negative for redness.  Respiratory:  Negative for cough and shortness of breath.   Cardiovascular:  Negative for chest pain and leg swelling.  Gastrointestinal:  Negative for abdominal pain, diarrhea and vomiting.  Genitourinary:  Negative for flank pain.  Musculoskeletal:  Negative for back pain and neck pain.  Skin:  Negative for rash.  Neurological:  Negative for headaches.  Hematological:  Does not bruise/bleed easily.  Psychiatric/Behavioral:  Negative for confusion.     Physical Exam Updated Vital Signs BP (!)  154/98   Pulse 69   Temp 97.7 F (36.5 C) (Oral)   Resp 15   Ht 1.803 m (5\' 11" )   Wt 89 kg   SpO2 99%   BMI 27.37 kg/m  Physical Exam Vitals and nursing note reviewed.  Constitutional:      Appearance: Normal appearance. He is well-developed.  HENT:     Head: Atraumatic.     Nose: Nose normal.     Mouth/Throat:     Mouth: Mucous membranes are moist.     Pharynx: Oropharynx is clear.  Eyes:     General: No scleral icterus.    Conjunctiva/sclera: Conjunctivae normal.  Neck:     Trachea: No tracheal deviation.     Comments: Trachea midline. No neck mass.  Cardiovascular:     Rate and Rhythm: Normal rate and regular rhythm.     Pulses: Normal pulses.     Heart sounds: Normal heart sounds. No murmur heard.    No friction rub. No gallop.  Pulmonary:     Effort: Pulmonary effort is normal. No accessory muscle usage or respiratory distress.     Breath sounds: Normal breath sounds.  Abdominal:     General: Bowel sounds are normal. There is no distension.     Palpations: Abdomen is soft. There is no mass.     Tenderness: There is no abdominal tenderness. There is no guarding.  Musculoskeletal:        General: No swelling or tenderness.     Cervical back: Normal range of motion and neck supple. No rigidity.     Right lower leg: No edema.     Left lower leg: No edema.  Skin:    General: Skin is warm and dry.     Findings: No rash.  Neurological:     Mental Status: He is alert.     Comments: Alert, speech clear. Motor/sens grossly intact bil.   Psychiatric:        Mood and Affect: Mood normal.     ED Results / Procedures / Treatments   Labs (all labs ordered are listed, but only abnormal results are displayed) Results for orders placed or performed during the hospital encounter of 08/10/22  CBC  Result Value Ref Range   WBC 4.0 4.0 - 10.5 K/uL   RBC 4.58 4.22 - 5.81 MIL/uL   Hemoglobin 14.1 13.0 - 17.0 g/dL   HCT 16.1 09.6 - 04.5 %   MCV 92.1 80.0 - 100.0 fL   MCH  30.8 26.0 - 34.0 pg   MCHC 33.4 30.0 - 36.0 g/dL   RDW 40.9 81.1 - 91.4 %   Platelets 217 150 - 400 K/uL   nRBC 0.0 0.0 - 0.2 %  Comprehensive metabolic panel  Result Value Ref Range   Sodium 140 135 - 145 mmol/L   Potassium 4.2 3.5 - 5.1 mmol/L  Chloride 104 98 - 111 mmol/L   CO2 20 (L) 22 - 32 mmol/L   Glucose, Bld 90 70 - 99 mg/dL   BUN 13 8 - 23 mg/dL   Creatinine, Ser 1.61 (H) 0.61 - 1.24 mg/dL   Calcium 09.6 8.9 - 04.5 mg/dL   Total Protein 7.7 6.5 - 8.1 g/dL   Albumin 4.3 3.5 - 5.0 g/dL   AST 32 15 - 41 U/L   ALT 25 0 - 44 U/L   Alkaline Phosphatase 42 38 - 126 U/L   Total Bilirubin 1.8 (H) 0.3 - 1.2 mg/dL   GFR, Estimated 51 (L) >60 mL/min   Anion gap 16 (H) 5 - 15  Troponin I (High Sensitivity)  Result Value Ref Range   Troponin I (High Sensitivity) 6 <18 ng/L     EKG EKG Interpretation Date/Time:  Friday August 10 2022 14:13:50 EDT Ventricular Rate:  99 PR Interval:  152 QRS Duration:  86 QT Interval:  344 QTC Calculation: 441 R Axis:   66  Text Interpretation: Normal sinus rhythm Nonspecific T wave abnormality No significant change since last tracing Confirmed by Cathren Laine ([id number]) on 08/10/2022 5:26:03 PM  Radiology DG Chest Port 1 View  Result Date: 08/10/2022 CLINICAL DATA:  Shortness of breath and dysphagia EXAM: PORTABLE CHEST 1 VIEW COMPARISON:  Radiograph 01/29/2018 FINDINGS: Stable cardiomediastinal silhouette. Left basilar atelectasis or scarring. Otherwise no focal consolidation, pleural effusion, or pneumothorax. Presumed nipple shadow overlying the left lower lung. No displaced rib fractures. IMPRESSION: No active disease. Electronically Signed   By: Minerva Fester M.D.   On: 08/10/2022 18:56    Procedures Procedures    Medications Ordered in ED Medications - No data to display  ED Course/ Medical Decision Making/ A&P                             Medical Decision Making Problems Addressed: Difficulty swallowing solids: acute illness  or injury with systemic symptoms that poses a threat to life or bodily functions Elevated blood pressure reading: acute illness or injury Esophageal dysphagia: acute illness or injury with systemic symptoms that poses a threat to life or bodily functions Essential hypertension: chronic illness or injury with exacerbation, progression, or side effects of treatment that poses a threat to life or bodily functions  Amount and/or Complexity of Data Reviewed Independent Historian:     Details: Family/friend, hx External Data Reviewed: notes. Labs: ordered. Decision-making details documented in ED Course. Radiology: ordered and independent interpretation performed. Decision-making details documented in ED Course. Discussion of management or test interpretation with external provider(s): GI, discussed pt - requests office f/u.   Risk Prescription drug management. Decision regarding hospitalization.   Iv ns. Continuous pulse ox and cardiac monitoring. Labs ordered/sent. Imaging ordered.   Differential diagnosis includes esophageal stricture, dysphagia, acs, mass, pna, etc.  Dispo decision including potential need for admission considered - will get labs and imaging and reassess.   Reviewed nursing notes and prior charts for additional history. External reports reviewed. Additional history from: family/friend.   Cardiac monitor: sinus rhythm, rate 66. GI consulted.   Labs reviewed/interpreted by me - k normal. Hgb normal. Wbc normal. Trop normal - after symptoms for days, felt not c/w acs. No current chest pain or discomfort.   Xrays reviewed/interpreted by me - no pna or mass noted.   GI consulted, discussed pt with Dr Tomasa Rand - he indicates will take pt info, and leave  with office staff for expedited follow up in office this coming week, and eval and discuss EGD then. Indicates if able, could get barium swallow in ED.  Radiology indicates not able to get emergently now.   Pt is able to  swallow fluids/smoothies. No current pain or discomfort.   Rec close gi f/u this coming week.   Return precautions provided.    At d/c, pt requests rx inhaler for home.           Final Clinical Impression(s) / ED Diagnoses Final diagnoses:  None    Rx / DC Orders ED Discharge Orders     None           Cathren Laine, MD 08/10/22 2206

## 2022-08-13 ENCOUNTER — Telehealth: Payer: Self-pay | Admitting: *Deleted

## 2022-08-13 NOTE — Telephone Encounter (Signed)
-----   Message from Nurse Kerrie Buffalo sent at 08/13/2022  8:11 AM EDT ----- Regarding: FW: ED follow up for dysphagia  ----- Message ----- From: Jenel Lucks, MD Sent: 08/10/2022   6:04 PM EDT To: Chrystie Nose, RN Subject: ED follow up for dysphagia                     Bonita Quin,  Can you see if anyone (MD or APP) has an appointment in the next 1-2 weeks?  He has severe dysphagia (unable to tolerate solids).  Went to ED today  Thanks

## 2022-08-13 NOTE — Telephone Encounter (Signed)
 Called patient and LMTCB.

## 2022-08-14 ENCOUNTER — Encounter: Payer: Self-pay | Admitting: Neurology

## 2022-08-14 ENCOUNTER — Encounter: Payer: Self-pay | Admitting: Gastroenterology

## 2022-08-14 ENCOUNTER — Telehealth: Payer: Self-pay | Admitting: *Deleted

## 2022-08-14 NOTE — Telephone Encounter (Signed)
-----   Message from Nurse Kerrie Buffalo sent at 08/13/2022  8:11 AM EDT ----- Regarding: FW: ED follow up for dysphagia  ----- Message ----- From: Jenel Lucks, MD Sent: 08/10/2022   6:04 PM EDT To: Chrystie Nose, RN Subject: ED follow up for dysphagia                     Bonita Quin,  Can you see if anyone (MD or APP) has an appointment in the next 1-2 weeks?  He has severe dysphagia (unable to tolerate solids).  Went to ED today  Thanks

## 2022-08-14 NOTE — Telephone Encounter (Signed)
Called patient to notify of appt needed to be scheduled per Dr. Tomasa Rand. Unable to contact patient at this time. LMTCB x' 3.

## 2022-08-15 ENCOUNTER — Other Ambulatory Visit: Payer: Self-pay

## 2022-08-15 DIAGNOSIS — R202 Paresthesia of skin: Secondary | ICD-10-CM

## 2022-08-24 ENCOUNTER — Ambulatory Visit (INDEPENDENT_AMBULATORY_CARE_PROVIDER_SITE_OTHER): Payer: Medicare Other | Admitting: Neurology

## 2022-08-24 DIAGNOSIS — M5417 Radiculopathy, lumbosacral region: Secondary | ICD-10-CM

## 2022-08-24 DIAGNOSIS — R202 Paresthesia of skin: Secondary | ICD-10-CM | POA: Diagnosis not present

## 2022-08-24 NOTE — Procedures (Signed)
[facility]  [address] [place], [address]  [place], [place] [address] Tel: [phone] Fax: [phone] Test Date:  08/24/2022  Patient: Dan Sandoval DOB: 26-Jan-1951 Physician: Nita Sickle, DO  Sex: Male Height: 5\' 11"  Ref Phys: Velta Addison, MD  ID#: [id number]   Technician:    History: This is a 72 year old man referred for evaluation of bilateral feet numbness and tingling.  NCV & EMG Findings: Electrodiagnostic testing of the right lower extremity and additional studies of the left shows: Bilateral sural and superficial peroneal sensory responses are within normal limits. Bilateral peroneal and tibial motor responses are within normal limits. Bilateral tibial H reflex studies are within normal limits. Chronic motor axonal loss changes are seen affecting the L5 myotome bilaterally, without accompanying active denervation.   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 There is no evidence of a large fiber sensorimotor polyneuropathy affecting either lower extremity.   ___________________________ Nita Sickle, DO    Nerve Conduction Studies   Stim Site NR Peak (ms) Norm Peak (ms) O-P Amp (V) Norm O-P Amp  Left Sup Peroneal Anti Sensory (Ant Lat Mall)  32 C  12 cm    2.7 <4.6 12.4 >3  Right Sup Peroneal Anti Sensory (Ant Lat Mall)  32 C  12 cm    2.3 <4.6 10.7 >3  Left Sural Anti Sensory (Lat Mall)  32 C  Calf    2.7 <4.6 13.0 >3  Right Sural Anti Sensory (Lat Mall)  32 C  Calf    2.7 <4.6 9.8 >3     Stim Site NR Onset (ms) Norm Onset (ms) O-P Amp (mV) Norm O-P Amp Site1 Site2 Delta-0 (ms) Dist (cm) Vel (m/s) Norm Vel (m/s)  Left Peroneal Motor (Ext Dig Brev)  32 C  Ankle    3.9 <6.0 4.4 >2.5 B Fib Ankle 8.9 39.0 44 >40  B Fib    12.8  3.9  Poplt B Fib 2.1 10.0 48 >40  Poplt    14.9  3.5         Right Peroneal Motor (Ext Dig Brev)  32 C  Ankle    3.2 <6.0 3.6 >2.5 B Fib Ankle 7.8 41.0 53 >40  B Fib     11.0  3.0  Poplt B Fib 1.9 10.0 53 >40  Poplt    12.9  2.9         Left Tibial Motor (Abd Hall Brev)  32 C  Ankle    3.4 <6.0 7.1 >4 Knee Ankle 9.6 40.0 42 >40  Knee    13.0  4.4         Right Tibial Motor (Abd Hall Brev)  32 C  Ankle    4.3 <6.0 9.2 >4 Knee Ankle 9.9 43.0 43 >40  Knee    14.2  6.3          Electromyography   Side Muscle Ins.Act Fibs Fasc Recrt Amp Dur Poly Activation Comment  Right AntTibialis Nml Nml Nml *1- *1+ *1+ *1+ Nml N/A  Right Gastroc Nml Nml Nml Nml Nml Nml Nml Nml N/A  Right Flex Dig Long Nml Nml Nml *1- *1+ *1+ *1+ Nml N/A  Right RectFemoris Nml Nml Nml Nml Nml Nml Nml Nml N/A  Right BicepsFemS Nml Nml Nml Nml Nml Nml Nml Nml N/A  Right GluteusMed Nml Nml Nml Nml Nml Nml Nml Nml N/A  Left AntTibialis Nml Nml Nml *1- *1+ *1+ *1+ Nml N/A  Left Gastroc  Nml Nml Nml Nml Nml Nml Nml Nml N/A  Left Flex Dig Long Nml Nml Nml *1- *1+ *1+ *1+ Nml N/A  Left RectFemoris Nml Nml Nml Nml Nml Nml Nml Nml N/A  Left GluteusMed Nml Nml Nml Nml Nml Nml Nml Nml N/A      Waveforms:

## 2022-09-12 DIAGNOSIS — G4486 Cervicogenic headache: Secondary | ICD-10-CM | POA: Diagnosis not present

## 2022-09-12 DIAGNOSIS — M9901 Segmental and somatic dysfunction of cervical region: Secondary | ICD-10-CM | POA: Diagnosis not present

## 2022-09-12 DIAGNOSIS — M25511 Pain in right shoulder: Secondary | ICD-10-CM | POA: Diagnosis not present

## 2022-09-12 DIAGNOSIS — M542 Cervicalgia: Secondary | ICD-10-CM | POA: Diagnosis not present

## 2022-09-20 DIAGNOSIS — N5313 Anejaculatory orgasm: Secondary | ICD-10-CM | POA: Diagnosis not present

## 2022-09-20 DIAGNOSIS — R6889 Other general symptoms and signs: Secondary | ICD-10-CM | POA: Diagnosis not present

## 2022-11-12 ENCOUNTER — Ambulatory Visit: Payer: Medicare Other | Admitting: Gastroenterology

## 2022-11-24 ENCOUNTER — Encounter (HOSPITAL_COMMUNITY): Payer: Self-pay

## 2022-11-24 ENCOUNTER — Emergency Department (HOSPITAL_COMMUNITY)
Admission: EM | Admit: 2022-11-24 | Discharge: 2022-11-24 | Disposition: A | Discharge status: Home / Self Care | Payer: Medicare PPO

## 2022-11-24 ENCOUNTER — Other Ambulatory Visit: Payer: Self-pay

## 2022-11-24 DIAGNOSIS — Z09 Encounter for follow-up examination after completed treatment for conditions other than malignant neoplasm: Secondary | ICD-10-CM | POA: Diagnosis present

## 2022-11-24 DIAGNOSIS — N390 Urinary tract infection, site not specified: Secondary | ICD-10-CM | POA: Diagnosis not present

## 2022-11-24 LAB — URINALYSIS, ROUTINE W REFLEX MICROSCOPIC
Bilirubin Urine: NEGATIVE
Glucose, UA: NEGATIVE mg/dL
Hgb urine dipstick: NEGATIVE
Ketones, ur: NEGATIVE mg/dL
Leukocytes,Ua: NEGATIVE
Nitrite: NEGATIVE
Protein, ur: NEGATIVE mg/dL
Specific Gravity, Urine: 1.015 (ref 1.005–1.030)
pH: 7 (ref 5.0–8.0)

## 2022-11-24 NOTE — ED Triage Notes (Signed)
Pt requesting UTI check due to his wife coming in and getting checked for UTI. Pt denies any symptoms.

## 2022-11-24 NOTE — ED Provider Notes (Signed)
[place] EMERGENCY DEPARTMENT AT [facility] Provider Note   CSN: [id number] Arrival date & time: 11/24/22  1802     History  Chief Complaint  Patient presents with   Follow-up    Dan Sandoval is a 72 y.o. male.  Patient to ED to be tested for UTI. He reports his wife has developed a UTI and wanted him to be checked. No concern for STD. No fever, nausea, dysuria, testicular pain, penile discharge.   The history is provided by the patient. No language interpreter was used.       Home Medications Prior to Admission medications   Medication Sig Start Date End Date Taking? Authorizing Provider  acetaminophen (TYLENOL) 500 MG tablet Take 500 mg by mouth 2 (two) times daily as needed for mild pain.    [provider]  albuterol (VENTOLIN HFA) 108 (90 Base) MCG/ACT inhaler Inhale 2 puffs into the lungs every 4 (four) hours as needed for wheezing or shortness of breath. 08/10/22   Cathren Laine, MD  amLODipine (NORVASC) 10 MG tablet Take 10 mg by mouth daily.    [provider]  Brinzolamide-Brimonidine 1-0.2 % SUSP Place 1 drop into both eyes in the morning, at noon, and at bedtime.    [provider]  busPIRone (BUSPAR) 10 MG tablet Take 10 mg by mouth 2 (two) times daily.    [provider]  Cholecalciferol (VITAMIN D) 50 MCG (2000 UT) tablet Take 2,000 Units by mouth daily.    [provider]  hydrophilic ointment Apply topically as needed for dry skin.    [provider]  hydrOXYzine (VISTARIL) 25 MG capsule Take 25 mg by mouth See admin instructions. Once daily in the morning. May take an additional 25mg  as needed for anxiety.    [provider]  latanoprost (XALATAN) 0.005 % ophthalmic solution Place 1 drop into both eyes at bedtime.    [provider]  losartan (COZAAR) 100 MG tablet Take 50 mg by mouth daily.    [provider]  methocarbamol (ROBAXIN) 500 MG tablet Take 500 mg by  mouth at bedtime as needed for muscle spasms.    [provider]  Multiple Vitamin (MULTIVITAMIN WITH MINERALS) TABS tablet Take 1 tablet by mouth daily.    [provider]  mupirocin cream (BACTROBAN) 2 % Apply 1 application topically 2 (two) times daily. Patient taking differently: Apply 1 application topically 2 (two) times daily as needed (dry skin on ankles). 01/14/20   Rhys Martini, PA-C  omeprazole (PRILOSEC) 20 MG capsule Take 1 capsule (20 mg total) by mouth daily for 20 days. 01/18/20 02/07/20  Cheryll Cockayne, MD  prazosin (MINIPRESS) 1 MG capsule Take 1 mg by mouth at bedtime.    [provider]  tadalafil (CIALIS) 5 MG tablet Take 5 mg by mouth daily as needed for erectile dysfunction.    [provider]      Allergies    Atorvastatin, Lisinopril, Penicillins, Pravastatin, Rosuvastatin, and Simvastatin    Review of Systems   Review of Systems  Physical Exam Updated Vital Signs BP (!) 137/97 (BP Location: Right Arm)   Pulse 78   Temp 98.7 F (37.1 C) (Oral)   Resp 16   Ht 5\' 11"  (1.803 m)   Wt 86.2 kg   SpO2 100%   BMI 26.50 kg/m  Physical Exam Vitals and nursing note reviewed.  Cardiovascular:     Rate and Rhythm: Normal rate.  Pulmonary:  Effort: Pulmonary effort is normal.  Abdominal:     Palpations: Abdomen is soft.     Tenderness: There is no abdominal tenderness.  Skin:    General: Skin is warm and dry.  Neurological:     Mental Status: He is oriented to person, place, and time.     ED Results / Procedures / Treatments   Labs (all labs ordered are listed, but only abnormal results are displayed) Labs Reviewed  URINALYSIS, ROUTINE W REFLEX MICROSCOPIC - Abnormal; Notable for the following components:      Result Value   APPearance HAZY (*)    All other components within normal limits  GC/CHLAMYDIA PROBE AMP ([place]) NOT AT [facility]    EKG None  Radiology No results found.  Procedures Procedures     Medications Ordered in ED Medications - No data to display  ED Course/ Medical Decision Making/ A&P Clinical Course as of 11/24/22 2039  Sat Nov 24, 2022  1952 Urine collected to r/o UTI. He reports he had one about one month ago so this will verify clearing. GC/chlamydia also pending.  [SU]  2038 UA negative. Patient is appropriate for discharge home.  [SU]    Clinical Course User Index [SU] Elpidio Anis, PA-C                                 Medical Decision Making Amount and/or Complexity of Data Reviewed Labs: ordered.           Final Clinical Impression(s) / ED Diagnoses Final diagnoses:  Follow-up exam    Rx / DC Orders ED Discharge Orders     None         Danne Harbor 11/24/22 2039    Durwin Glaze, MD 11/24/22 [phone]

## 2022-11-24 NOTE — Discharge Instructions (Signed)
Your urine test is negative. Cultures are pending and will result in about 2 days. You will be contacted with any positive findings.   Follow up with your doctor as needed.

## 2022-11-26 LAB — GC/CHLAMYDIA PROBE AMP (~~LOC~~) NOT AT ARMC
Chlamydia: NEGATIVE
Comment: NEGATIVE
Comment: NORMAL
Neisseria Gonorrhea: NEGATIVE

## 2023-07-10 ENCOUNTER — Encounter (HOSPITAL_COMMUNITY): Payer: Self-pay

## 2023-07-10 ENCOUNTER — Ambulatory Visit (HOSPITAL_COMMUNITY)
Admission: EM | Admit: 2023-07-10 | Discharge: 2023-07-10 | Disposition: A | Discharge status: Home / Self Care | Payer: Self-pay | Attending: Emergency Medicine | Admitting: Emergency Medicine

## 2023-07-10 DIAGNOSIS — K30 Functional dyspepsia: Secondary | ICD-10-CM

## 2023-07-10 DIAGNOSIS — R1033 Periumbilical pain: Secondary | ICD-10-CM

## 2023-07-10 MED ORDER — OMEPRAZOLE 20 MG PO CPDR: 20.0000 mg | DELAYED_RELEASE_CAPSULE | Freq: Every day | ORAL | 0 refills | Status: AC

## 2023-07-10 NOTE — ED Triage Notes (Signed)
 Pt c/o center achy abdominal pain almost every morning for the past year. Last NBM this am. Denies taking any meds for sx's.

## 2023-07-10 NOTE — Discharge Instructions (Addendum)
 As discussed it is likely that your symptoms could be related to some indigestion. Start taking omeprazole  once daily to help prevent this.  I recommend taking this at bedtime to try to prevent morning indigestion. Avoid eating 2-3 hours prior to going to bed. [facility].  They may be able to refer you to gastroenterology if necessary. Return here as needed.

## 2023-07-10 NOTE — ED Provider Notes (Signed)
 [facility]    CSN: [id number] Arrival date & time: 07/10/23  1024      History   Chief Complaint Chief Complaint  Patient presents with   Abdominal Pain    HPI Dan Sandoval is a 73 y.o. male.   Patient presents with central abdominal pain that occurs between 6 and 7 AM every morning upon waking for the past year.  Patient reports having normal bowel movements daily without difficulty.  Patient states that he occasionally has some mild nausea with this pain.  Patient states that the pain usually resolves upon having a bowel movement, but often resolves on its own.    Patient denies nausea, vomiting, diarrhea, blood in vomit or stool, persistent abdominal pain, urinary symptoms, and fever.  Patient states that the pain does not worsen with eating or activity.  Patient denies taking any medication for the symptoms.  Patient states that he thinks he might have irritable bowel syndrome.  Patient states he had a colonoscopy about a year ago that was normal.  Patient states that he is been seen by the [place] for this issue and he stated that he was told there was nothing wrong.  The history is provided by the patient and medical records.  Abdominal Pain   Past Medical History:  Diagnosis Date   Chronic kidney disease    Hypertension     There are no active problems to display for this patient.   Past Surgical History:  Procedure Laterality Date   JOINT REPLACEMENT     left knee ligament repair       Home Medications    Prior to Admission medications   Medication Sig Start Date End Date Taking? Authorizing Provider  acetaminophen  (TYLENOL ) 500 MG tablet Take 500 mg by mouth 2 (two) times daily as needed for mild pain.    [provider]  amLODipine (NORVASC) 10 MG tablet Take 10 mg by mouth daily.    [provider]  Brinzolamide-Brimonidine 1-0.2 % SUSP Place 1 drop into both eyes in the morning, at noon, and at bedtime.    [provider]  busPIRone (BUSPAR) 10 MG tablet Take 10 mg by mouth 2 (two) times daily.    [provider]  Cholecalciferol (VITAMIN D) 50 MCG (2000 UT) tablet Take 2,000 Units by mouth daily.    [provider]  hydrophilic ointment Apply topically as needed for dry skin.    [provider]  hydrOXYzine (VISTARIL) 25 MG capsule Take 25 mg by mouth See admin instructions. Once daily in the morning. May take an additional 25mg  as needed for anxiety.    [provider]  latanoprost (XALATAN) 0.005 % ophthalmic solution Place 1 drop into both eyes at bedtime.    [provider]  losartan (COZAAR) 100 MG tablet Take 50 mg by mouth daily.    [provider]  methocarbamol (ROBAXIN) 500 MG tablet Take 500 mg by mouth at bedtime as needed for muscle spasms.    [provider]  Multiple Vitamin (MULTIVITAMIN WITH MINERALS) TABS tablet Take 1 tablet by mouth daily.    [provider]  omeprazole  (PRILOSEC) 20 MG capsule Take 1 capsule (20 mg total) by mouth daily. 07/10/23  Yes Rosevelt Constable, Aidric Endicott A, NP  prazosin (MINIPRESS) 1 MG capsule Take 1 mg by mouth at bedtime.    [provider]  tadalafil (CIALIS) 5 MG tablet Take 5 mg by mouth daily as needed for erectile dysfunction.  [provider]    Family History History reviewed. No pertinent family history.  Social History Social History   Tobacco Use   Smoking status: Former   Smokeless tobacco: Never  Advertising account planner   Vaping status: Never Used  Substance Use Topics   Alcohol use: No   Drug use: No     Allergies   Atorvastatin, Lisinopril, Penicillins, Pravastatin, Rosuvastatin, and Simvastatin   Review of Systems Review of Systems  Gastrointestinal:  Positive for abdominal pain.   Per HPI  Physical Exam Triage Vital Signs ED Triage Vitals  Encounter Vitals Group     BP 07/10/23 1147 (!) 167/96     Systolic BP Percentile --      Diastolic BP  Percentile --      Pulse Rate 07/10/23 1147 78     Resp 07/10/23 1147 18     Temp 07/10/23 1147 97.6 F (36.4 C)     Temp Source 07/10/23 1147 Oral     SpO2 07/10/23 1147 98 %     Weight --      Height --      Head Circumference --      Peak Flow --      Pain Score 07/10/23 1148 0     Pain Loc --      Pain Education --      Exclude from Growth Chart --    No data found.  Updated Vital Signs BP (!) 167/96 (BP Location: Left Arm)   Pulse 78   Temp 97.6 F (36.4 C) (Oral)   Resp 18   SpO2 98%   Visual Acuity Right Eye Distance:   Left Eye Distance:   Bilateral Distance:    Right Eye Near:   Left Eye Near:    Bilateral Near:     Physical Exam Vitals and nursing note reviewed.  Constitutional:      General: He is not in acute distress.    Appearance: He is well-developed. He is not ill-appearing.  Cardiovascular:     Rate and Rhythm: Normal rate and regular rhythm.  Pulmonary:     Effort: Pulmonary effort is normal.     Breath sounds: Normal breath sounds.  Abdominal:     General: Abdomen is flat. Bowel sounds are normal. There is no distension.     Palpations: Abdomen is soft. There is no mass.     Tenderness: There is no abdominal tenderness. There is no guarding or rebound.     Hernia: No hernia is present.  Neurological:     Mental Status: He is alert.      UC Treatments / Results  Labs (all labs ordered are listed, but only abnormal results are displayed) Labs Reviewed - No data to display  EKG   Radiology No results found.  Procedures Procedures (including critical care time)  Medications Ordered in UC Medications - No data to display  Initial Impression / Assessment and Plan / UC Course  I have reviewed the triage vital signs and the nursing notes.  Pertinent labs & imaging results that were available during my care of the patient were reviewed by me and considered in my medical decision making (see chart for details).     Patient is  well-appearing.  Vitals are stable.  Blood pressure is elevated at 167/96.  Patient reports that he did take his blood pressure medication today.  Denies any symptoms related to this.  No significant findings upon exam.  Abdomen is flat,  soft, and nontender.  Bowel sounds are normal.  Discussed that symptoms could likely be related to some indigestion.  Prescribed omeprazole  in attempt to help with this.  [facility] if needed.  Discussed return precautions. Final Clinical Impressions(s) / UC Diagnoses   Final diagnoses:  Periumbilical abdominal pain  Indigestion     Discharge Instructions      As discussed it is likely that your symptoms could be related to some indigestion. Start taking omeprazole  once daily to help prevent this.  I recommend taking this at bedtime to try to prevent morning indigestion. Avoid eating 2-3 hours prior to going to bed. [facility] for [facility] of this.  They may be able to refer you to gastroenterology if necessary. Return here as needed.  ED Prescriptions     Medication Sig Dispense Auth. Provider   omeprazole  (PRILOSEC) 20 MG capsule Take 1 capsule (20 mg total) by mouth daily. 20 capsule Levora Reas A, NP      PDMP not reviewed this encounter.   Levora Reas A, NP 07/10/23 1240

## 2024-01-16 ENCOUNTER — Ambulatory Visit
Admission: RE | Admit: 2024-01-16 | Discharge: 2024-01-16 | Disposition: A | Source: Ambulatory Visit | Attending: Physician Assistant | Admitting: Physician Assistant

## 2024-01-16 ENCOUNTER — Other Ambulatory Visit: Payer: Self-pay | Admitting: Physician Assistant

## 2024-01-16 DIAGNOSIS — M25511 Pain in right shoulder: Secondary | ICD-10-CM
